# Patient Record
Sex: Female | Born: 1935 | Race: Black or African American | Hispanic: No
Health system: Southern US, Community
[De-identification: ages and names within clinical notes are randomized; demographics above are authoritative.]

---

## 2009-02-19 ENCOUNTER — Ambulatory Visit: Payer: Self-pay | Admitting: Internal Medicine

## 2009-02-23 ENCOUNTER — Ambulatory Visit: Payer: Self-pay | Admitting: Internal Medicine

## 2009-03-02 ENCOUNTER — Ambulatory Visit: Payer: Self-pay | Admitting: Internal Medicine

## 2009-06-06 ENCOUNTER — Ambulatory Visit: Payer: Self-pay | Admitting: Internal Medicine

## 2009-06-17 ENCOUNTER — Ambulatory Visit: Payer: Self-pay | Admitting: Internal Medicine

## 2010-05-04 ENCOUNTER — Ambulatory Visit: Payer: Self-pay | Admitting: Family Medicine

## 2010-12-24 ENCOUNTER — Ambulatory Visit: Payer: Self-pay | Admitting: Family Medicine

## 2010-12-24 ENCOUNTER — Inpatient Hospital Stay: Payer: Self-pay | Admitting: Specialist

## 2012-02-09 ENCOUNTER — Ambulatory Visit: Payer: Self-pay | Admitting: Internal Medicine

## 2012-02-09 ENCOUNTER — Inpatient Hospital Stay: Payer: Self-pay | Admitting: Internal Medicine

## 2012-02-09 LAB — CBC WITH DIFFERENTIAL/PLATELET
Basophil #: 0.1 10*3/uL (ref 0.0–0.1)
Basophil %: 0.8 %
Eosinophil #: 0 10*3/uL (ref 0.0–0.7)
HGB: 10.5 g/dL — ABNORMAL LOW (ref 12.0–16.0)
Lymphocyte #: 0.5 10*3/uL — ABNORMAL LOW (ref 1.0–3.6)
MCH: 31.3 pg (ref 26.0–34.0)
MCHC: 32.9 g/dL (ref 32.0–36.0)
Monocyte #: 0.1 x10 3/mm — ABNORMAL LOW (ref 0.2–0.9)
Neutrophil %: 90.3 %
RBC: 3.36 10*6/uL — ABNORMAL LOW (ref 3.80–5.20)
RDW: 15 % — ABNORMAL HIGH (ref 11.5–14.5)

## 2012-02-09 LAB — CK TOTAL AND CKMB (NOT AT ARMC)
CK, Total: 75 U/L (ref 21–215)
CK-MB: 1.5 ng/mL (ref 0.5–3.6)

## 2012-02-09 LAB — COMPREHENSIVE METABOLIC PANEL
Alkaline Phosphatase: 75 U/L (ref 50–136)
BUN: 19 mg/dL — ABNORMAL HIGH (ref 7–18)
Calcium, Total: 8.8 mg/dL (ref 8.5–10.1)
Chloride: 102 mmol/L (ref 98–107)
Co2: 27 mmol/L (ref 21–32)
EGFR (African American): 43 — ABNORMAL LOW
EGFR (Non-African Amer.): 38 — ABNORMAL LOW
Osmolality: 276 (ref 275–301)
Potassium: 4.4 mmol/L (ref 3.5–5.1)
SGOT(AST): 17 U/L (ref 15–37)
Sodium: 136 mmol/L (ref 136–145)
Total Protein: 7.1 g/dL (ref 6.4–8.2)

## 2012-02-09 LAB — TROPONIN I
Troponin-I: <0.02 ng/mL
Troponin-I: <0.02 ng/mL

## 2012-02-10 LAB — LIPID PANEL
Cholesterol: 243 mg/dL — ABNORMAL HIGH (ref 0–200)
HDL Cholesterol: 82 mg/dL — ABNORMAL HIGH (ref 40–60)
Triglycerides: 51 mg/dL (ref 0–200)
VLDL Cholesterol, Calc: 10 mg/dL (ref 5–40)

## 2012-02-10 LAB — BASIC METABOLIC PANEL
Anion Gap: 12 (ref 7–16)
Calcium, Total: 8.7 mg/dL (ref 8.5–10.1)
Chloride: 106 mmol/L (ref 98–107)
EGFR (African American): 40 — ABNORMAL LOW
EGFR (Non-African Amer.): 35 — ABNORMAL LOW
Glucose: 138 mg/dL — ABNORMAL HIGH (ref 65–99)
Sodium: 142 mmol/L (ref 136–145)

## 2012-02-10 LAB — CBC WITH DIFFERENTIAL/PLATELET
Basophil #: 0 10*3/uL (ref 0.0–0.1)
Basophil %: 0.2 %
HGB: 10.1 g/dL — ABNORMAL LOW (ref 12.0–16.0)
Lymphocyte %: 5 %
MCH: 30.7 pg (ref 26.0–34.0)
MCHC: 32.3 g/dL (ref 32.0–36.0)
MCV: 95 fL (ref 80–100)
Neutrophil #: 6.3 10*3/uL (ref 1.4–6.5)
Neutrophil %: 93.8 %
Platelet: 225 10*3/uL (ref 150–440)
RBC: 3.29 10*6/uL — ABNORMAL LOW (ref 3.80–5.20)
RDW: 15.2 % — ABNORMAL HIGH (ref 11.5–14.5)
WBC: 6.7 10*3/uL (ref 3.6–11.0)

## 2012-02-12 LAB — CREATININE, SERUM
Creatinine: 1.48 mg/dL — ABNORMAL HIGH (ref 0.60–1.30)
Creatinine: 1.49 mg/dL — ABNORMAL HIGH (ref 0.60–1.30)
EGFR (African American): 35 — ABNORMAL LOW

## 2012-02-13 LAB — BASIC METABOLIC PANEL
BUN: 32 mg/dL — ABNORMAL HIGH (ref 7–18)
Calcium, Total: 7.3 mg/dL — ABNORMAL LOW (ref 8.5–10.1)
Co2: 23 mmol/L (ref 21–32)
EGFR (African American): 44 — ABNORMAL LOW
Glucose: 154 mg/dL — ABNORMAL HIGH (ref 65–99)
Osmolality: 302 (ref 275–301)
Potassium: 3.9 mmol/L (ref 3.5–5.1)
Sodium: 147 mmol/L — ABNORMAL HIGH (ref 136–145)

## 2012-02-13 LAB — HEMOGLOBIN: HGB: 9.2 g/dL — ABNORMAL LOW (ref 12.0–16.0)

## 2012-02-14 LAB — PLATELET COUNT: Platelet: 191 10*3/uL (ref 150–440)

## 2012-02-14 LAB — HEMOGLOBIN: HGB: 9.2 g/dL — ABNORMAL LOW (ref 12.0–16.0)

## 2012-02-15 LAB — BASIC METABOLIC PANEL
BUN: 30 mg/dL — ABNORMAL HIGH (ref 7–18)
Chloride: 108 mmol/L — ABNORMAL HIGH (ref 98–107)
Co2: 24 mmol/L (ref 21–32)
EGFR (Non-African Amer.): 37 — ABNORMAL LOW
Glucose: 86 mg/dL (ref 65–99)
Osmolality: 287 (ref 275–301)
Potassium: 4 mmol/L (ref 3.5–5.1)
Sodium: 141 mmol/L (ref 136–145)

## 2012-02-15 LAB — CBC WITH DIFFERENTIAL/PLATELET
Basophil #: 0 10*3/uL (ref 0.0–0.1)
Basophil %: 0 %
Eosinophil #: 0 10*3/uL (ref 0.0–0.7)
Eosinophil %: 0 %
HCT: 27 % — ABNORMAL LOW (ref 35.0–47.0)
Lymphocyte #: 1.1 10*3/uL (ref 1.0–3.6)
Monocyte #: 0.6 x10 3/mm (ref 0.2–0.9)
Monocyte %: 6.9 %
Platelet: 186 10*3/uL (ref 150–440)
RDW: 15.3 % — ABNORMAL HIGH (ref 11.5–14.5)
WBC: 8.7 10*3/uL (ref 3.6–11.0)

## 2012-06-08 ENCOUNTER — Ambulatory Visit: Payer: Self-pay | Admitting: Family Medicine

## 2012-06-08 LAB — URINALYSIS, COMPLETE
Blood: NEGATIVE
Ketone: NEGATIVE
Nitrite: NEGATIVE
Ph: 6.5 (ref 4.5–8.0)
Protein: NEGATIVE
Specific Gravity: 1.015 (ref 1.003–1.030)

## 2012-06-20 ENCOUNTER — Ambulatory Visit: Payer: Self-pay | Admitting: Family Medicine

## 2012-07-18 IMAGING — NM NM LUNG SCAN
2 series · 16 of 16 positions shown · non-contrast
Comparison: none

REASON FOR EXAM: hypoxia
COMMENTS:

[Series 1000: lung perfusion · 1.95mm/px · 4 acquisitions, 8 frames shown]
[im 1/4]
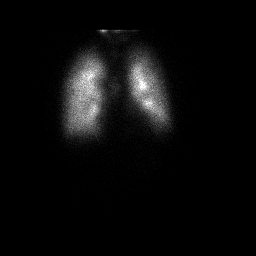
[im 1/4]
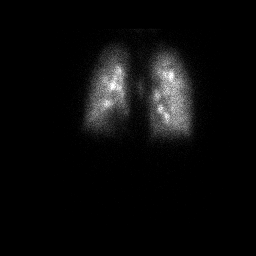
[im 2/4]
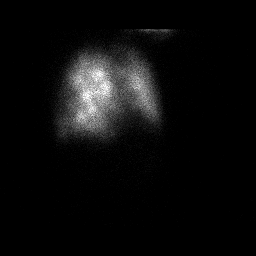
[im 2/4]
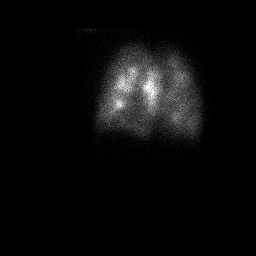
[im 3/4]
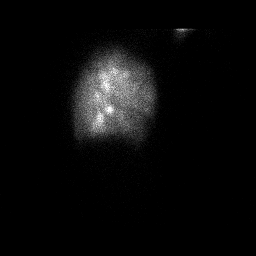
[im 3/4]
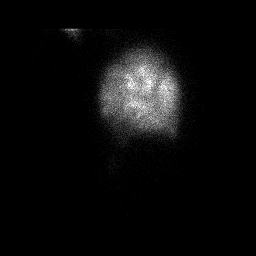
[im 4/4]
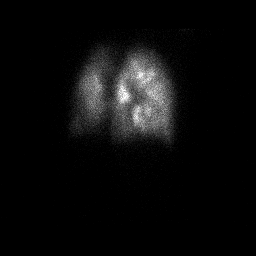
[im 4/4]
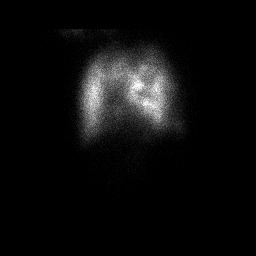

[Series 1000: lung ventilation · 3.90mm/px · 4 acquisitions, 8 frames shown]
[im 1/4]
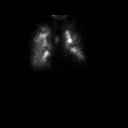
[im 1/4]
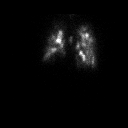
[im 2/4]
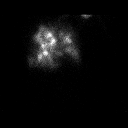
[im 2/4]
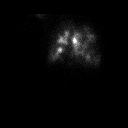
[im 3/4]
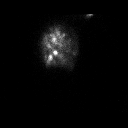
[im 3/4]
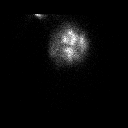
[im 4/4]
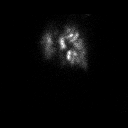
[im 4/4]
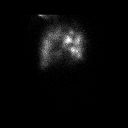

[16 of 16 positions shown; findings below may reference images not displayed]

PROCEDURE:     NM  - NM VQ LUNG SCAN  - [DATE] [DATE] [DATE]  [DATE]

RESULT:     [DATE] mCi of technetium 99 M DTPA were placed in the nebulizer
for ventilation imaging. 3.94 mCi of technetium 99 M MAA were utilized for
perfusion. Correlation is made with the chest x-ray dated 13 February, 2012 at

The lungs are hyperinflated. There is some density at the right lung base
which could represent a small cavitary lesion. COPD is present. The lung
markings are prominent. The heart is normal. Atherosclerotic calcification
is present.

Ventilation images show a large amount of central deposition with poor
tracer localization peripherally. This causes artifact on the perfusion
images. Both the ventilation and perfusion images show significant
heterogeneity. The patient apparently was not able to fully comply with the
study which limits the utility. A no well-circumscribed lobar defects are
seen on the perfusion images. The overall perfusion pattern appears to be
better and less heterogeneous than the ventilation pattern.
IMPRESSION: Abnormal ventilation/perfusion lung scan. Intermediate
probability of PE. Significant central tracer deposition on the ventilation
images Colles artifact on the perfusion images. There is diffuse
heterogeneous deposition on both ventilation and perfusion images however
the perfusion images show a better appearance than the ventilation images
suggesting that this is a predominantly ventilatory abnormality. CT of the
chest followup may be beneficial.

[REDACTED](*)

## 2012-11-22 IMAGING — CR DG KNEE COMPLETE 4+V*R*
1 series · 4 of 4 positions shown · non-contrast
Comparison: none

REASON FOR EXAM: contusion, pt fell down stairs
COMMENTS:

PROCEDURE:     MDR - MDR KNEE RT COMPLETE W/OBLIQUES  - June 20, 2012  [DATE]
RESULT:     Comparison: None.

[Series 1: ap · 0.17mm/px · 4 of 4 slices shown]
[im 1/4]
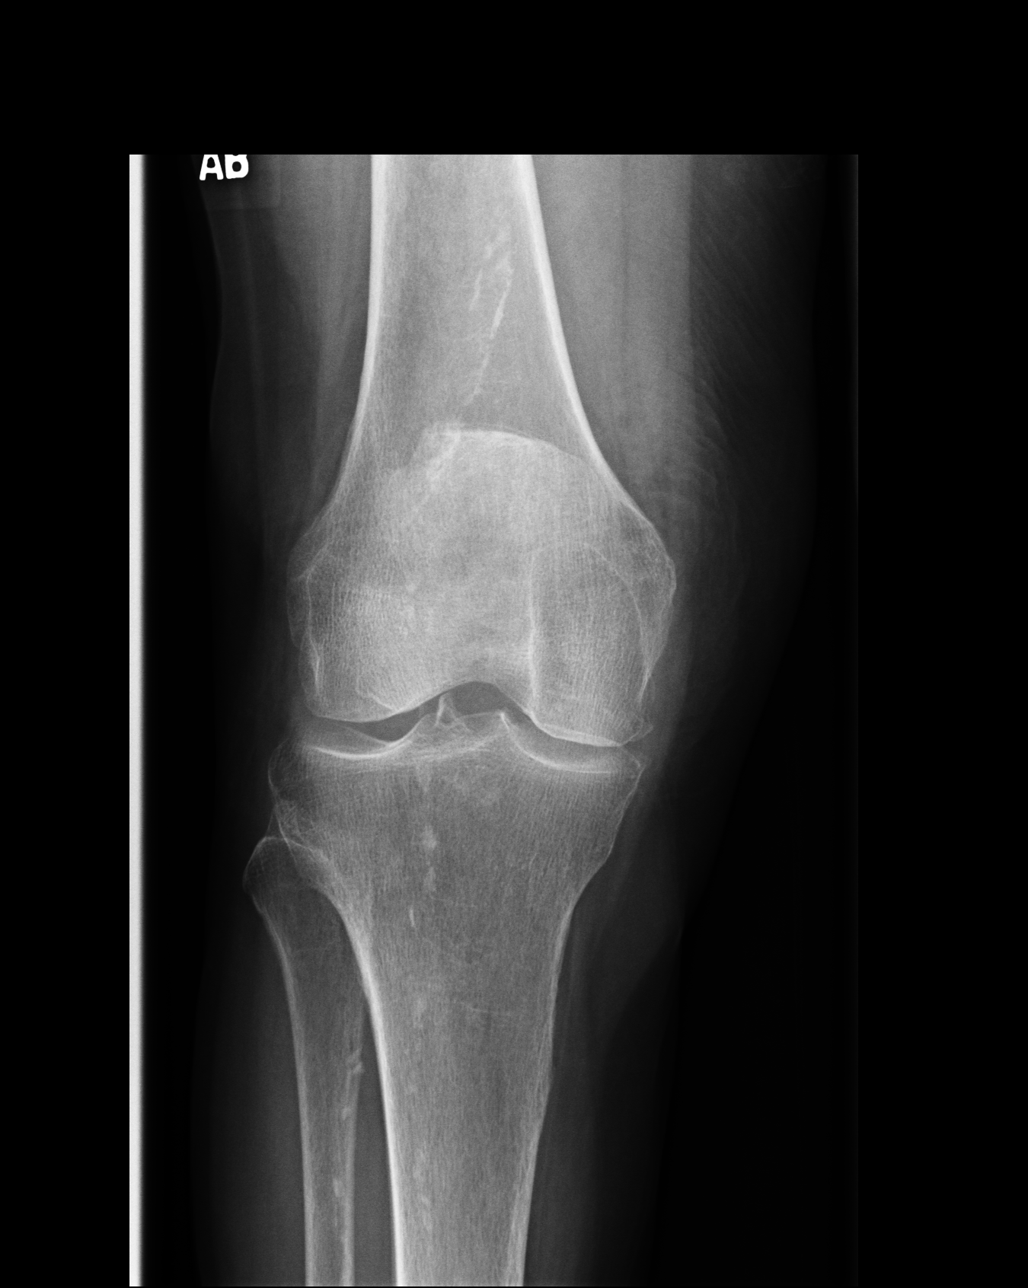
[im 2/4]
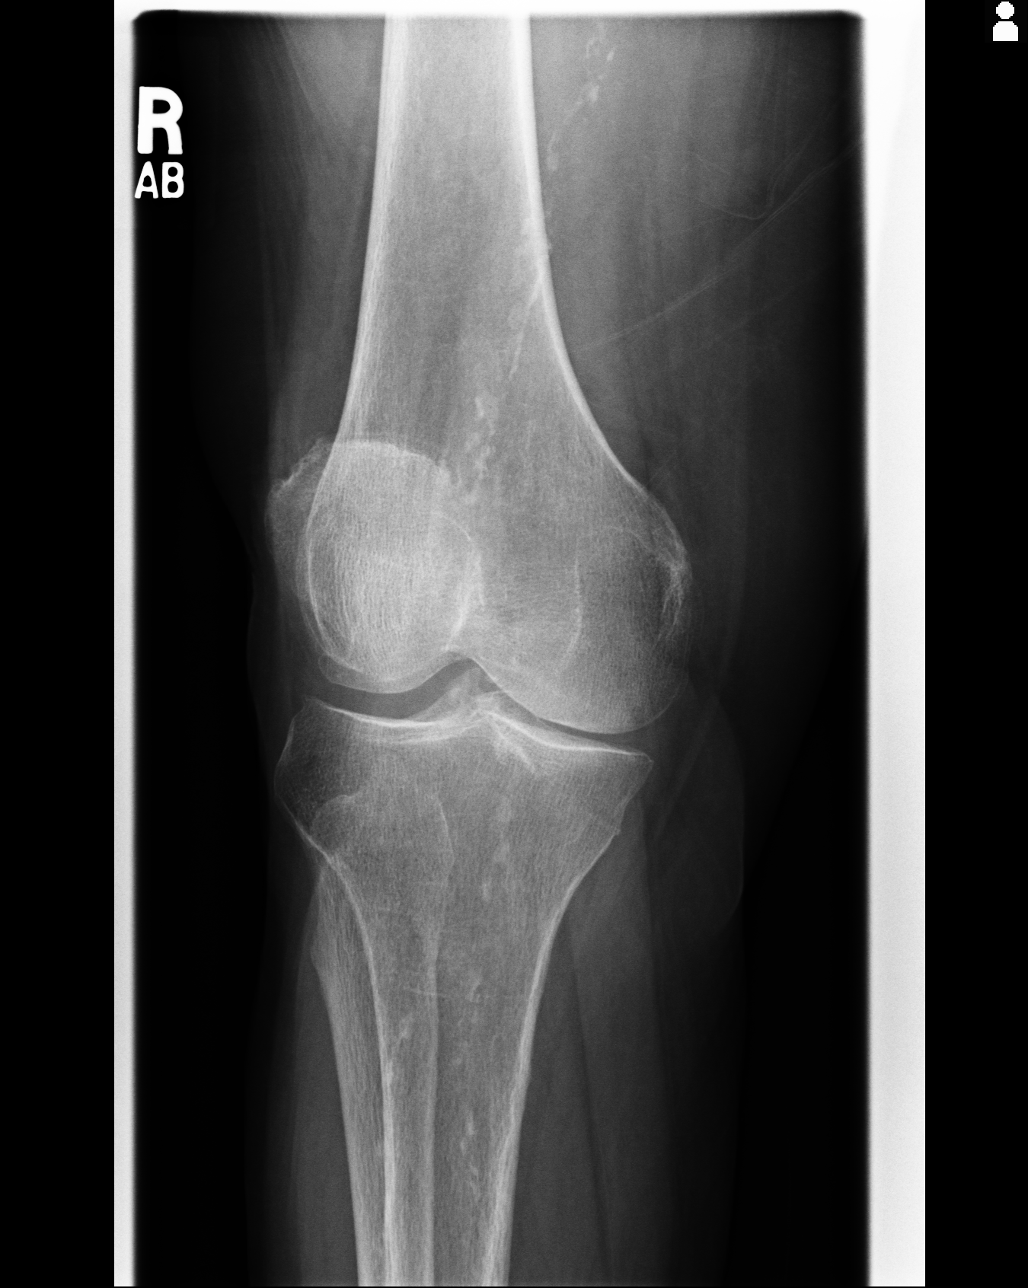
[im 3/4]
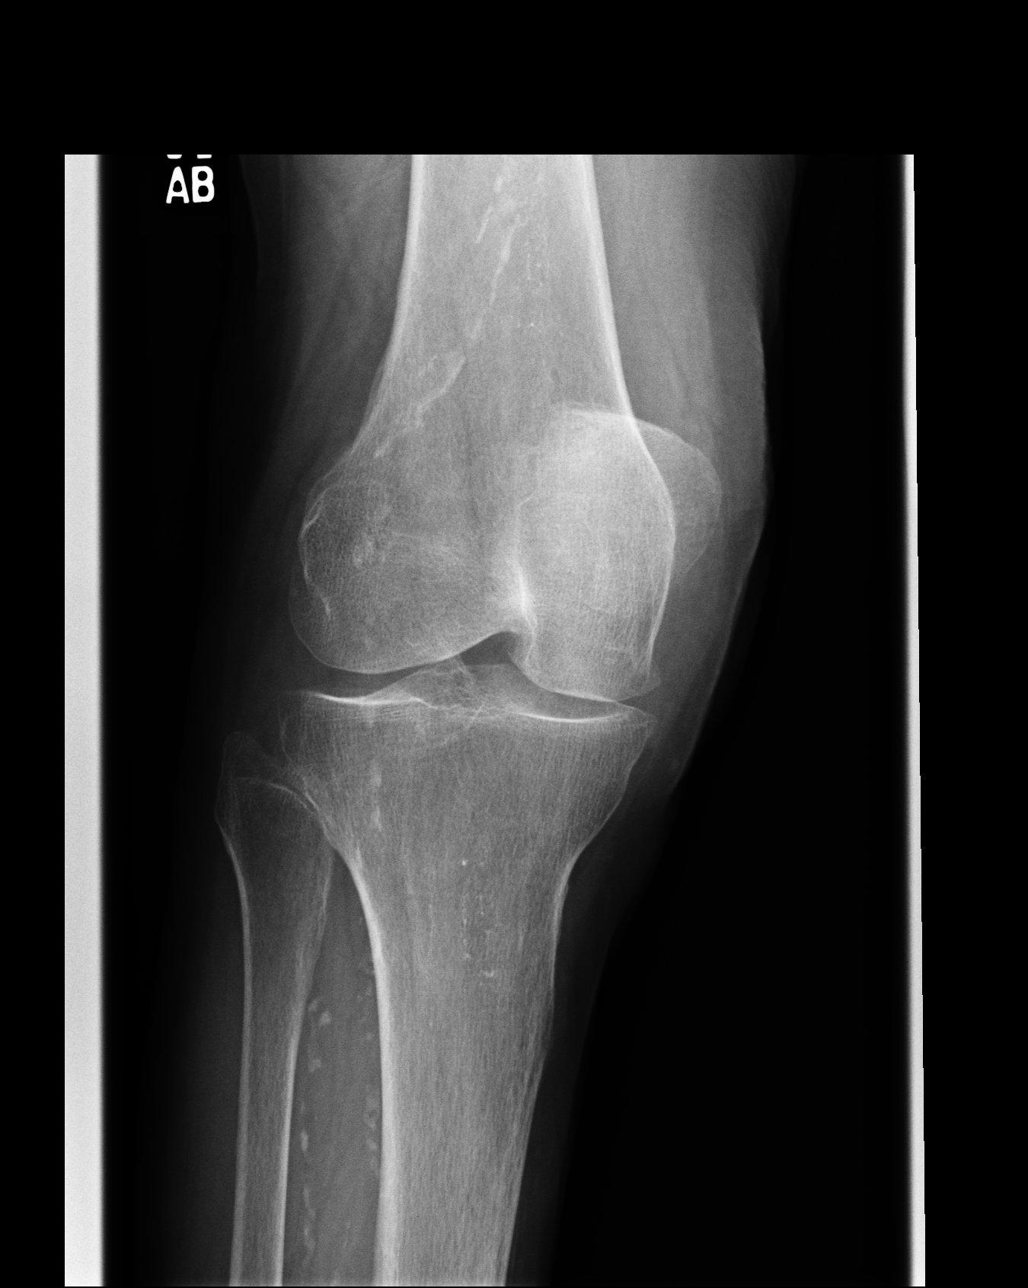
[im 4/4]
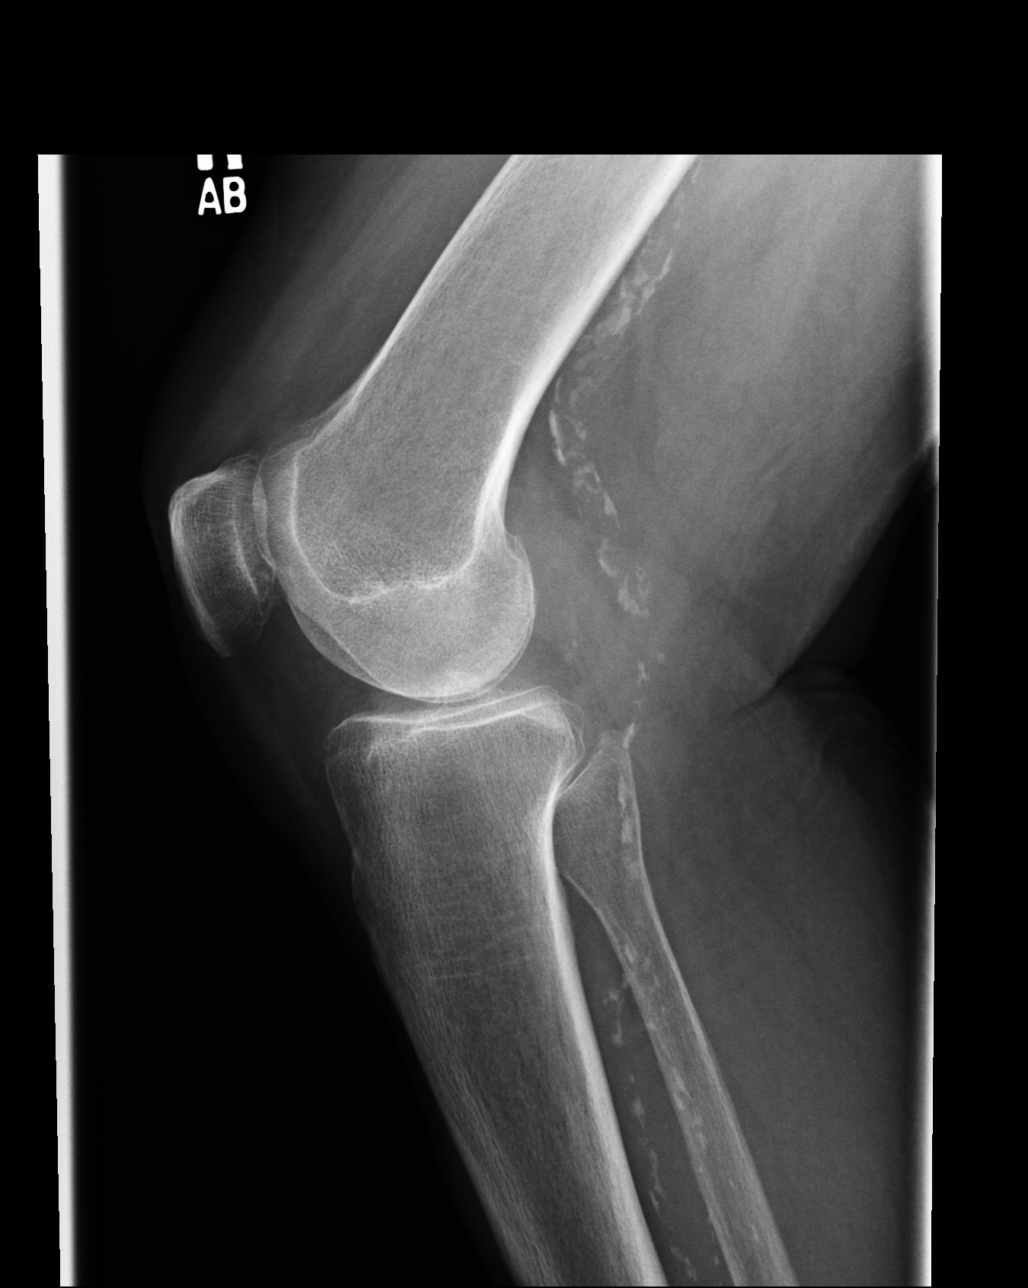

[4 of 4 positions shown; findings below may reference images not displayed]

FINDINGS: No acute fracture. There is minimal osteophytosis in the medial compartment.
Vascular calcifications are present. No significant joint effusion seen.
IMPRESSION: No acute fracture.

[REDACTED]

## 2015-02-16 NOTE — H&P (Signed)
PATIENT NAME:  Rachel Orozco, Rachel Orozco MR#:  [id number] DATE OF BIRTH:  1914/11/24  DATE OF ADMISSION:  02/09/2012  PRIMARY CARE PHYSICIAN: Dr. Clayborn Bigness  REFERRING PHYSICIAN: Patient referred in from [facility], Dr. Dayton Scrape, for a direct admission  CHIEF COMPLAINT: Shortness of breath.   HISTORY OF PRESENT ILLNESS: This is a 81 year old female with history of asthma, chronic obstructive pulmonary disease. She has been using her nebulizer at home but it has not helped her. Her chest has been sore for the past day, 10/10 in intensity. Nothing making it better or worse. She has been very tired. Pain is all over the chest. She saw Dr. Quillian Quince she states possibly on Wednesday and was given steroids twice a day. She has been wheezing, cough, whitish phlegm. When she went to the [facility] chest x-ray was negative. Her chest pain seemed to improve with oxygen supplementation. Troponin was negative. She was referred in for further evaluation.   PAST MEDICAL HISTORY:  1. Chronic obstructive pulmonary disease. 2. Asthma.  3. Chronic kidney disease. 4. Constipation. 5. Anemia. 6. Tobacco abuse.   PAST SURGICAL HISTORY: Bilateral cataracts.   ALLERGIES: Penicillin and sulfa.   MEDICATIONS: As per patient include: 1. MiraLax 17 grams per day. 2. Aspirin 81 mg daily.  3. Nebulizer 4 times a day. 4. Albuterol inhaler as needed. 5. Recently started on steroids. Daughter is coming to bring in the medications to confirm anything different.   SOCIAL HISTORY: Lives with her daughter. Positive for smoking 1 pack per week. No alcohol. No drug use. 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   FAMILY HISTORY: Mother died of old age. Father died of the flu.   REVIEW OF SYSTEMS: CONSTITUTIONAL: Positive for fatigue. No fever, chills, or sweats. No weight gain. No weight loss. EYES: She does wear glasses. Has been decreased vision. States  that she went to the eye doctor and was started on some eyedrops. EARS, NOSE, MOUTH, AND THROAT: She does blow her nose a lot. CARDIOVASCULAR: Positive for chest pain. No palpitations. RESPIRATORY: Positive for shortness of breath. Positive for wheezing. Positive for coughing, whitish phlegm. No hemoptysis. GASTROINTESTINAL: Positive for constipation. No nausea. No vomiting. No abdominal pain. No bright red blood per rectum. No melena. GENITOURINARY: No burning on urination. No hematuria. INTEGUMENT: Did have a rash on her leg about a month ago. MUSCULOSKELETAL: No joint pain. NEUROLOGIC: No fainting or blackouts. PSYCHIATRIC: No anxiety or depression. ENDOCRINE: No thyroid problems. HEMATOLOGIC/LYMPHATIC: Positive for anemia.   PHYSICAL EXAMINATION:  VITAL SIGNS: Pulse 66, respirations 24, blood pressure 154/67, pulse oximetry 96% on room air, temperature not yet documented.   GENERAL: No respiratory distress.   EYES: Conjunctivae normal. Lids no lesions. Pupils equal, round, and reactive to light. Extraocular muscles intact. No nystagmus.   EARS, NOSE, MOUTH, AND THROAT: Nasal mucosa no erythema. Throat no erythema. No exudate seen. Lips and gums no lesions.   NECK: No JVD. No bruits. No lymphadenopathy. No thyromegaly. No thyroid nodules palpated.   RESPIRATORY: Audible wheeze heard without even using the stethoscope. No use of accessory muscles to breathe. Has to breathe fast while talking. Positive wheeze throughout entire lung field with decreased breath sounds bilaterally.   CARDIOVASCULAR: S1, S2 normal. No gallops, rubs, or murmurs heard. Carotid upstroke 2+ bilaterally. No bruits. Dorsalis pedis pulses 2+ bilaterally. No edema of the lower extremity.   ABDOMEN: Soft, nontender. No organomegaly/splenomegaly.  Normoactive bowel sounds. No masses felt.   LYMPHATIC: No lymph nodes in the neck.   MUSCULOSKELETAL: No clubbing. No edema. No cyanosis.   SKIN: No ulcers or lesions seen.    NEUROLOGIC: Cranial nerves II through XII grossly intact. Deep tendon reflexes 2+ bilateral lower extremities.   PSYCHIATRIC: Patient is oriented to person, place, and time.   LABORATORY, DIAGNOSTIC, AND RADIOLOGICAL DATA: [facility] showed a right bundle branch block. White blood cell count 6.6, hemoglobin and hematocrit 10.5 and 32.0, platelet count 228, glucose 133, BUN 19, creatinine 1.23, sodium 136, potassium 4.4, chloride 102, CO2 27, calcium 8.8. Liver function tests normal range. Albumin slightly low at 3.3. GFR 43. D-dimer 1.05. Troponin negative. CPK 75. Chest x-ray: No acute abnormality.   ASSESSMENT AND PLAN:  1. Chronic obstructive pulmonary disease exacerbation with poor air entry and wheeze. Will put on IV Solu-Medrol 125 mg IV STAT and 60 mg IV q.6 hours, Levaquin IV. Will put on DuoNeb nebulizer solution. Start Spiriva and Symbicort.   2. Chest pain, most likely secondary to the chronic obstructive pulmonary disease exacerbation and difficulty moving air. Will put on telemetry. Get serial cardiac enzymes. Patient already on an aspirin.  3. Tobacco abuse. Smoking cessation counseling done, three minutes by me. No need for nicotine patch patient will go cold [place].  4. Chronic kidney disease stage III. Will continue to monitor.  5. Constipation. Continue MiraLax.  6. Anemia. Hemoglobin stable from last year.  7. Elevated d-dimer 1.0. Well's score is zero. Higher likelihood of shortness of breath caused by chronic obstructive pulmonary disease exacerbation. Will not workup this elevated d-dimer further.  8. Impaired fasting glucose. Will check a hemoglobin A1c.  TIME SPENT ON ADMISSION: 50 minutes.   CODE STATUS: Patient is a FULL CODE.   ____________________________ Herschell Dimesichard J. Renae GlossWieting, MD rjw:cms D: 02/09/2012 21:03:37 ET T: 02/10/2012 06:26:25 ET JOB#: [id number]  cc: Herschell Dimesichard J. Renae GlossWieting, MD, <Dictator> Burley SaverL. Katherine Bliss, MD Salley ScarletICHARD J Amore Ackman  MD ELECTRONICALLY SIGNED 02/14/2012 21:26

## 2015-02-16 NOTE — Discharge Summary (Signed)
PATIENT NAME:  Rachel Orozco, Rachel Orozco MR#:  [id number] DATE OF BIRTH:  02-Aug-1915  DATE OF ADMISSION:  02/09/2012 DATE OF DISCHARGE:  02/15/2012  ADMITTING DIAGNOSIS: Chronic obstructive pulmonary disease exacerbation.   DISCHARGE DIAGNOSES:  1. Chronic obstructive pulmonary disease exacerbation and hypoxia, resolved.  2. Questionable pulmonary vascular congestion. 3. Questionable congestive heart failure, acute on chronic, unknown if it is systolic or diastolic or combined.  4. Chest pain, resolved.  5. Negative cardiac enzymes x3.  6. Ongoing tobacco abuse.  7. History of chronic obstructive pulmonary disease with baseline creatinine of 1.2.  8. Anemia.  9. Hyperglycemia, hemoglobin A1c 5.4.  10. Hyperlipidemia with LDL 158.   DISCHARGE CONDITION: Stable.   DISCHARGE MEDICATIONS: The patient is to resume her outpatient medications which are: 1. Symbicort 160/4.5, 2 puffs b.i.d.   2. Aspirin 81 mg p.o. daily.   ADDITIONAL MEDICATIONS:  1. MiraLax 17 grams p.o. daily as needed.  2. Spiriva 1 capsule inhalation daily.  3. Nicotine patch 14 mg topically daily.  4. Nicotine oral inhaler, 1 cartridge every 1 hour as needed.  5. Lasix 20 mg p.o. every 2 days.  6. Colace 100 mg p.o. b.i.d.  7. Senokot 1 tablet p.o. daily as needed.  8. Levaquin 250 mg p.o. daily for 4 more days.  9. Prednisone 30 mg p.o. once on 02/16/2012, then taper x10 mg daily until stopped.  10. Lipitor 20 mg p.o. at bedtime DIET:  2 grams salt, low fat, low cholesterol.   PHYSICAL ACTIVITY LIMITATIONS: As tolerated.   REFERRAL: Home Health, Physical Therapy, as well as Charity fundraiser.    FOLLOWUP:  1. Follow-up appointment with Dr. Juliann Pares on 02/17/2012 in his office.  2. Follow-up appointment with Dr. Quillian Quince on 02/18/2012.    CONSULTANTS: Care Management.    LABORATORY, DIAGNOSTIC AND RADIOLOGICAL DATA:  Chest PA and lateral on 02/13/2012 showed chronic obstructive pulmonary disease and likely underlying component of  pulmonary fibrosis. Pulmonary vascular congestion cannot be excluded. Small left pleural effusion versus chronic scarring was noted. Smoothly marginated nodular density in the right lower lobe. Differential considerations were possible small lymph node versus possibly inflammatory disease, however, pulmonary nodule cannot be excluded. Repeated chest x-ray, PA and lateral, on 02/14/2012 showed trace pleural effusion, stable density in the right lung base laterally. A small cavitary mass is not excluded. VQ scan on 02/14/2012 showed abnormal ventilation/perfusion lung scan, intermediate probability for PE. Significant central tracer deposition on the ventilation images. Colles artifact on the perfusion images. There is diffuse heterogeneous deposition of both ventilation and perfusion images; however, the perfusion images show a better appearance than the ventilation images suggesting that this is predominantly ventilatory abnormality. CT of the chest followup may be beneficial.  Lab data done on 02/10/2012 revealed BUN and creatinine of 22 and 1.31, glucose 138, BNP otherwise was unremarkable. The patient's estimated GFR for African American was 40 and for a non African-American was only 35.  The patient's cardiac enzymes x3 were unremarkable.  White blood cell count was normal at 6.7, hemoglobin was 10.1, platelet count 225. Absolute neutrophil count was not elevated at 6.3   HISTORY AND PHYSICAL: The patient is a 81 year old female with past medical history significant for history of chronic obstructive pulmonary disease, history of congestive heart failure, ongoing tobacco abuse, who presented to the hospital with complaints of shortness of breath. Please refer to Dr. Mathews Robinsons  admission note on 02/09/2012.   On arrival to the hospital, the patient's was pulse 66, respiration rate  24, blood pressure 154/67, saturation was 96% on oxygen therapy. Physical exam revealed audible wheezes heard without using a  stethoscope. She was not using accessory muscles to breathe; however, she was having difficulty breathing even while talking, and positive wheezes throughout the entire lung fields. Diffuse breath sounds were also noted bilaterally. Physical exam was unremarkable.   HOSPITAL COURSE:  The patient was admitted to the hospital and diagnosed with chronic obstructive pulmonary disease exacerbation. She was on started on antibiotic therapy, also inhalation therapy, steroids and oxygen. Her condition somewhat improved, however, not fully. She was weaned steadily from oxygen, however, still was complaining of some discomfort and shortness of breath intermittently. She was not able to undergo CT scan of her chest, however, she had a VQ scan done which was remarkable for ventilation problems more than perfusion problems. It was felt that the patient very likely had pulmonary embolism. She was weaned off oxygen by the day of discharge, and her oxygenation remained quite stable on the day of discharge. Her vital signs on discharge showed temperature 97.2, pulse 65, respiration rate 18, blood pressure 116/61, saturation was 95 to 97% on room air at rest. The patient's condition, however, did not improve only on nebulizing therapy as well as therapy for chronic obstructive pulmonary disease exacerbation. On 02/14/2012, she was given Lasix. With this, her condition significantly improved, signifying that it could be that pulmonary congestion possibly with mild congestive heart failure, acute on chronic, could have been causing factor of the patient's shortness of breath. Echocardiogram was performed, however, not reported by the day of discharge. I discussed personally the patient's case with Dr. Juliann Pares, who agreed to see her as soon as possible. He is going to review the patient's echocardiogram and make decisions about medication therapy. As the patient's blood pressure was somewhat marginal, as well as because of the  patient's chronic renal insufficiency, she was not initiated on ACE inhibitor therapy. However, it would be prudent to initiate the patient on ACE inhibitor therapy if she does have cardiomyopathy. Lasix was given every second day. The patient will discuss with Dr. Juliann Pares as well as her primary care physician, Dr. Quillian Quince, should she continue Lasix therapy, and least intermittently.   The patient had some chest pains apparently coming into the hospital which she felt were related to her work of breathing; however, cardiac enzymes were negative and echocardiogram is still pending. The patient is going to be evaluated by Dr. Juliann Pares, who will make decisions about further cardiac testing if needed.   For ongoing tobacco abuse, the patient was counseled intermittently, and nicotine patch as well as nicotine oral inhaler was prescribed for her upon discharge.   In regards to chronic kidney disease, the patient's kidney function was somewhat a little impaired on  the day of arrival with creatinine level of 1.31, somewhat worsened to 1.49 by 02/12/2012; however, she was given some IV fluids and her kidney function improved. On the day of discharge, 02/15/2012, the patient's BUN and creatinine were 30 and 1.24, respectively.  I believe that this is her baseline. The patient is to follow up with her primary care physician and make decisions about Nephrology consultation as outpatient, if needed.   The patient was noted to be anemic with hemoglobin level of 10.1 on the day of admission. The patient's hemoglobin level drifted somewhat lower to 9.0 by the day of discharge. She did not have any obvious bleeding; however, it is recommended to follow the patient's hemoglobin levels  as outpatient to ensure stability.   The patient was evaluated for hyperlipidemia. A lipid panel was performed while she was in the hospital, and the patient was noted to have elevated LDL to 151. Cholesterol level was 243, and triglycerides  were 51 as well as HDL was 82. The patient's hemoglobin A1c was also checked and was found to be 5.8.  It was felt that the patient would benefit from a low fat, low cholesterol diet and followup with her primary care physician for recommendations in regards to management of her hyperlipidemia   For chronic constipation, the patient is to continue her outpatient medications as well as stool softeners and stimulants. She was prescribed some MiraLAX; however, she is to get outpatient Colace if she needs to. She will be given prescriptions for Colace as well as Senokot upon discharge.   The patient is being discharged in stable condition with the above-mentioned medications and followup.   TIME SPENT: 40 minutes.  ____________________________ Katharina Caperima Terilynn Buresh, MD rv:cbb D: 02/15/2012 19:56:03 ET T: 02/16/2012 10:45:00 ET JOB#: [id number]  cc: Katharina Caperima Nyoka Alcoser, MD, <Dictator> Burley SaverL. Katherine Bliss, MD Dwayne D. Juliann Paresallwood, MD Katharina CaperIMA Carrye Goller MD ELECTRONICALLY SIGNED 02/17/2012 20:18

## 2021-10-25 DEATH — deceased
